# Patient Record
Sex: Male | Born: 1995 | Race: White | Hispanic: No | Marital: Single | Smoking: Never smoker
Health system: Southern US, Community
[De-identification: ages and names within clinical notes are randomized; demographics above are authoritative.]

## PROBLEM LIST (undated history)

## (undated) DIAGNOSIS — F980 Enuresis not due to a substance or known physiological condition: Secondary | ICD-10-CM

## (undated) HISTORY — DX: Enuresis not due to a substance or known physiological condition: F98.0

---
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
## 2015-12-21 ENCOUNTER — Telehealth: Payer: Self-pay | Admitting: Internal Medicine

## 2015-12-21 NOTE — Telephone Encounter (Signed)
Patient's mother, Amy,called.  Patient needs a copy of his immunization records for college.  Please call Amy when it's ready for pick up.

## 2015-12-21 NOTE — Telephone Encounter (Signed)
Spoke to Mom. Record up front ready for pickup 

## 2016-01-19 ENCOUNTER — Encounter: Payer: Self-pay | Admitting: Internal Medicine

## 2016-01-19 ENCOUNTER — Ambulatory Visit (INDEPENDENT_AMBULATORY_CARE_PROVIDER_SITE_OTHER): Payer: 59 | Admitting: Internal Medicine

## 2016-01-19 VITALS — BP 122/80 | HR 100 | Temp 98.1°F | Wt 242.0 lb

## 2016-01-19 DIAGNOSIS — L03032 Cellulitis of left toe: Secondary | ICD-10-CM | POA: Insufficient documentation

## 2016-01-19 MED ORDER — CEPHALEXIN 500 MG PO TABS: 500.0000 mg | ORAL_TABLET | Freq: Four times a day (QID) | ORAL | Status: DC

## 2016-01-19 NOTE — Progress Notes (Signed)
Pre visit review using our clinic review tool, if applicable. No additional management support is needed unless otherwise documented below in the visit note. 

## 2016-01-19 NOTE — Assessment & Plan Note (Signed)
Will try keflex and soaks May need debridement of nail

## 2016-01-19 NOTE — Patient Instructions (Addendum)
Please soak your foot in warm water (you can add epsom salts if you want). If the swelling is better and the nail seems to be ingrown, you can come in for me to remove the ingrown part of the nail.  Paronychia Paronychia is an infection of the skin that surrounds a nail. It usually affects the skin around a fingernail, but it may also occur near a toenail. It often causes pain and swelling around the nail. This condition may come on suddenly or develop over a longer period. In some cases, a collection of pus (abscess) can form near or under the nail. Usually, paronychia is not serious and it clears up with treatment. CAUSES This condition may be caused by bacteria or fungi. It is commonly caused by either Streptococcus or Staphylococcus bacteria. The bacteria or fungi often cause the infection by getting into the affected area through an opening in the skin, such as a cut or a hangnail. RISK FACTORS This condition is more likely to develop in:  People who get their hands wet often, such as those who work as Fish farm manager, bartenders, or nurses.  People who bite their fingernails or suck their thumbs.  People who trim their nails too short.  People who have hangnails or injured fingertips.  People who get manicures.  People who have diabetes. SYMPTOMS Symptoms of this condition include:  Redness and swelling of the skin near the nail.  Tenderness around the nail when you touch the area.  Pus-filled bumps under the cuticle. The cuticle is the skin at the base or sides of the nail.  Fluid or pus under the nail.  Throbbing pain in the area. DIAGNOSIS This condition is usually diagnosed with a physical exam. In some cases, a sample of pus may be taken from an abscess to be tested in a lab. This can help to determine what type of bacteria or fungi is causing the condition. TREATMENT Treatment for this condition depends on the cause and severity of the condition. If the condition is mild,  it may clear up on its own in a few days. Your health care provider may recommend soaking the affected area in warm water a few times a day. When treatment is needed, the options may include:  Antibiotic medicine, if the condition is caused by a bacterial infection.  Antifungal medicine, if the condition is caused by a fungal infection.  Incision and drainage, if an abscess is present. In this procedure, the health care provider will cut open the abscess so the pus can drain out. HOME CARE INSTRUCTIONS  Soak the affected area in warm water if directed to do so by your health care provider. You may be told to do this for 20 minutes, 2-3 times a day. Keep the area dry in between soakings.  Take medicines only as directed by your health care provider.  If you were prescribed an antibiotic medicine, finish all of it even if you start to feel better.  Keep the affected area clean.  Do not try to drain a fluid-filled bump yourself.  If you will be washing dishes or performing other tasks that require your hands to get wet, wear rubber gloves. You should also wear gloves if your hands might come in contact with irritating substances, such as cleaners or chemicals.  Follow your health care provider's instructions about:  Wound care.  Bandage (dressing) changes and removal. SEEK MEDICAL CARE IF:  Your symptoms get worse or do not improve with treatment.  You have a fever or chills.  You have redness spreading from the affected area.  You have continued or increased fluid, blood, or pus coming from the affected area.  Your finger or knuckle becomes swollen or is difficult to move.   This information is not intended to replace advice given to you by your health care provider. Make sure you discuss any questions you have with your health care provider.   Document Released: 03/12/2001 Document Revised: 01/31/2015 Document Reviewed: 08/24/2014 Elsevier Interactive Patient Education [id number]  [employer]

## 2016-01-19 NOTE — Progress Notes (Signed)
   Subjective:    Patient ID: Trevor Davidson, male    DOB: Nov 02, 1995, 20 y.o.   MRN: [id number]  HPI Here due to left great toe injury  Got cleated in tag football game 4-5 months ago Seemed to get some better Then stubbed it bad 3-4 weeks ago Ongoing worsening  Very tender to touch Walks without pain--unless socks are rubbing  Has cleaned it and tried triple antibiotic ointment  No current outpatient prescriptions on file prior to visit.   No current facility-administered medications on file prior to visit.    Allergies  Allergen Reactions  . Sulfonamide Derivatives     Past Medical History  Diagnosis Date  . Nonorganic enuresis     No past surgical history on file.  No family history on file.  Social History   Social History  . Marital Status: Single    Spouse Name: N/A  . Number of Children: N/A  . Years of Education: N/A   Occupational History  . Not on file.   Social History Main Topics  . Smoking status: Never Smoker   . Smokeless tobacco: Never Used  . Alcohol Use: No  . Drug Use: No  . Sexual Activity: Not on file   Other Topics Concern  . Not on file   Social History Narrative   Parents married   2 sibs   Review of Systems  No fever Hasn't felt sick     Objective:   Physical Exam  Skin:  Marked swelling, redness and mild tenderness along lateral left great toe Some discharge Unable to tell if nail is ingrown due to the swelling          Assessment & Plan:

## 2016-02-14 ENCOUNTER — Encounter: Payer: Self-pay | Admitting: Internal Medicine

## 2016-02-14 ENCOUNTER — Ambulatory Visit (INDEPENDENT_AMBULATORY_CARE_PROVIDER_SITE_OTHER): Payer: 59 | Admitting: Internal Medicine

## 2016-02-14 VITALS — BP 118/82 | HR 75 | Temp 97.4°F | Wt 247.0 lb

## 2016-02-14 DIAGNOSIS — L600 Ingrowing nail: Secondary | ICD-10-CM | POA: Diagnosis not present

## 2016-02-14 MED ORDER — CEPHALEXIN 500 MG PO TABS: 500.0000 mg | ORAL_TABLET | Freq: Four times a day (QID) | ORAL | Status: DC

## 2016-02-14 NOTE — Progress Notes (Signed)
   Subjective:    Patient ID: Trevor Davidson, male    DOB: Sep 08, 1996, 20 y.o.   MRN: [id number]  HPI Here due to ongoing toe problems Not really any better  Current Outpatient Prescriptions on File Prior to Visit  Medication Sig Dispense Refill  . cetirizine-pseudoephedrine (ZYRTEC-D) 5-120 MG tablet Take 1 tablet by mouth 2 (two) times daily.     No current facility-administered medications on file prior to visit.    Allergies  Allergen Reactions  . Sulfonamide Derivatives     Past Medical History  Diagnosis Date  . Nonorganic enuresis     No past surgical history on file.  No family history on file.  Social History   Social History  . Marital Status: Single    Spouse Name: N/A  . Number of Children: N/A  . Years of Education: N/A   Occupational History  . Not on file.   Social History Main Topics  . Smoking status: Never Smoker   . Smokeless tobacco: Never Used  . Alcohol Use: No  . Drug Use: No  . Sexual Activity: Not on file   Other Topics Concern  . Not on file   Social History Narrative   Parents married   2 sibs      Review of Systems     Objective:   Physical Exam  Skin:  Left great toenail ingrown laterally with swelling and redness          Assessment & Plan:

## 2016-02-14 NOTE — Assessment & Plan Note (Signed)
PROCEDURE Digital block to left great toe Lateral resection of ingrown nail and debridement of devitalized tissue Tolerated well DSD with pressure applied Home care instructions given Extend antibiotic

## 2016-02-14 NOTE — Progress Notes (Signed)
Pre visit review using our clinic review tool, if applicable. No additional management support is needed unless otherwise documented below in the visit note. 

## 2016-04-16 ENCOUNTER — Ambulatory Visit (INDEPENDENT_AMBULATORY_CARE_PROVIDER_SITE_OTHER): Payer: 59 | Admitting: Internal Medicine

## 2016-04-16 ENCOUNTER — Encounter: Payer: Self-pay | Admitting: Internal Medicine

## 2016-04-16 VITALS — BP 118/80 | HR 75 | Temp 98.4°F | Wt 245.0 lb

## 2016-04-16 DIAGNOSIS — L600 Ingrowing nail: Secondary | ICD-10-CM

## 2016-04-16 MED ORDER — CEPHALEXIN 500 MG PO TABS: 500.0000 mg | ORAL_TABLET | Freq: Three times a day (TID) | ORAL | Status: AC

## 2016-04-16 NOTE — Progress Notes (Signed)
   Subjective:    Patient ID: Trevor Davidson, male    DOB: 07-11-1996, 20 y.o.   MRN: [id number]  HPI  Pt presents to the clinic todaywith a complaint of swelling and redness lateral to the left great toenail x 2 days.  He reports the same symptoms occurred 2 months ago, at which time he was seen for removal of an ingrown toenail and completed a course of Keflex with complete resolution of symptoms.  He reports the toe is warm and painful to the touch, and will occasionally have shooting pain in the toe at a severity of 3-4/10.  He admits to discharge but is unsure of the color, and denies fever, chills, or decreased range of motion of toes.       Review of Systems   Past Medical History  Diagnosis Date  . Nonorganic enuresis     Current Outpatient Prescriptions  Medication Sig Dispense Refill  . Cephalexin 500 MG tablet Take 1 tablet (500 mg total) by mouth 3 (three) times daily. 30 tablet 0  . cetirizine-pseudoephedrine (ZYRTEC-D) 5-120 MG tablet Take 1 tablet by mouth 2 (two) times daily. Reported on 04/16/2016     No current facility-administered medications for this visit.    Allergies  Allergen Reactions  . Sulfonamide Derivatives     History reviewed. No pertinent family history.  Social History   Social History  . Marital Status: Single    Spouse Name: N/A  . Number of Children: N/A  . Years of Education: N/A   Occupational History  . Not on file.   Social History Main Topics  . Smoking status: Never Smoker   . Smokeless tobacco: Never Used  . Alcohol Use: No  . Drug Use: No  . Sexual Activity: Not on file   Other Topics Concern  . Not on file   Social History Narrative   Parents married   2 sibs     Const: Denies fever or chills. Skin: Admits to redness, swelling, and discharge lateral to left great toenail. MSK: Denies decreased range of motion of toes.  No other specific complaints in a complete review of systems (except as listed in HPI  above).      Objective:   Physical Exam BP 118/80 mmHg  Pulse 75  Temp(Src) 98.4 F (36.9 C) (Oral)  Wt 245 lb (111.131 kg)  SpO2 99%  General: Well-appearing, in no acute distress. Skin: Erythema and swelling of left great toe lateral to nail with blood and discharge present, erythema extends proximally to interphalangeal joint, nontender to palpation.  Slight swelling present lateral to nail on right great toe.   MSK: Full AROM of toes.  Full AROM and 5/5 strength of ankle.       Assessment & Plan:   Ingrown toenail left great toe with infection/ingrown toenail right great toe without infection:  eRx for Keflex 500mg  TIDx 10 days Epson Salt soaks Referral to Podiatrist  RTC as needed or if symptoms persist or worsne

## 2016-04-16 NOTE — Patient Instructions (Signed)
Ingrown Toenail  An ingrown toenail occurs when the corner or sides of your toenail grow into the surrounding skin. The big toe is most commonly affected, but it can happen to any of your toes. If your ingrown toenail is not treated, you will be at risk for infection.  CAUSES  This condition may be caused by:  · Wearing shoes that are too small or tight.  · Injury or trauma, such as stubbing your toe or having your toe stepped on.  · Improper cutting or care of your toenails.  · Being born with (congenital) nail or foot abnormalities, such as having a nail that is too big for your toe.  RISK FACTORS  Risk factors for an ingrown toenail include:  · Age. Your nails tend to thicken as you get older, so ingrown nails are more common in older people.  · Diabetes.  · Cutting your toenails incorrectly.  · Blood circulation problems.  SYMPTOMS  Symptoms may include:  · Pain, soreness, or tenderness.  · Redness.  · Swelling.  · Hardening of the skin surrounding the toe.  Your ingrown toenail may be infected if there is fluid, pus, or drainage.  DIAGNOSIS   An ingrown toenail may be diagnosed by medical history and physical exam. If your toenail is infected, your health care provider may test a sample of the drainage.  TREATMENT  Treatment depends on the severity of your ingrown toenail. Some ingrown toenails may be treated at home. More severe or infected ingrown toenails may require surgery to remove all or part of the nail. Infected ingrown toenails may also be treated with antibiotic medicines.  HOME CARE INSTRUCTIONS  · If you were prescribed an antibiotic medicine, finish all of it even if you start to feel better.  · Soak your foot in warm soapy water for 20 minutes, 3 times per day or as directed by your health care provider.  · Carefully lift the edge of the nail away from the sore skin by wedging a small piece of cotton under the corner of the nail. This may help with the pain.  Be careful not to cause more injury  to the area.  · Wear shoes that fit well. If your ingrown toenail is causing you pain, try wearing sandals, if possible.  · Trim your toenails regularly and carefully. Do not cut them in a curved shape. Cut your toenails straight across. This prevents injury to the skin at the corners of the toenail.  · Keep your feet clean and dry.  · If you are having trouble walking and are given crutches by your health care provider, use them as directed.  · Do not pick at your toenail or try to remove it yourself.  · Take medicines only as directed by your health care provider.  · Keep all follow-up visits as directed by your health care provider. This is important.  SEEK MEDICAL CARE IF:  · Your symptoms do not improve with treatment.  SEEK IMMEDIATE MEDICAL CARE IF:  · You have red streaks that start at your foot and go up your leg.  · You have a fever.  · You have increased redness, swelling, or pain.  · You have fluid, blood, or pus coming from your toenail.     This information is not intended to replace advice given to you by your health care provider. Make sure you discuss any questions you have with your health care provider.     Document Released:   09/13/2000 Document Revised: 01/31/2015 Document Reviewed: 08/10/2014  [employer]vier Interactive Patient Education ©2016 [employer]

## 2016-04-16 NOTE — Progress Notes (Signed)
Pre visit review using our clinic review tool, if applicable. No additional management support is needed unless otherwise documented below in the visit note. 

## 2016-04-18 ENCOUNTER — Encounter: Payer: Self-pay | Admitting: Podiatry

## 2016-04-18 ENCOUNTER — Ambulatory Visit (INDEPENDENT_AMBULATORY_CARE_PROVIDER_SITE_OTHER): Payer: 59 | Admitting: Podiatry

## 2016-04-18 VITALS — BP 135/83 | HR 80 | Resp 16 | Ht 75.0 in | Wt 240.0 lb

## 2016-04-18 DIAGNOSIS — L600 Ingrowing nail: Secondary | ICD-10-CM

## 2016-04-18 NOTE — Progress Notes (Signed)
   Subjective:    Patient ID: Trevor DaughtersHeath C Davidson, male    DOB: Jul 15, 1996, 20 y.o.   MRN: [id number]  HPI Chief Complaint  Patient presents with  . Nail Problem    Left foot; great toe-lateral; pt stated, "Doctor gave him antibiotics starting yesterday 04/17/16"      Review of Systems  All other systems reviewed and are negative.      Objective:   Physical Exam        Assessment & Plan:

## 2016-04-18 NOTE — Patient Instructions (Signed)

## 2016-04-20 NOTE — Progress Notes (Signed)
Subjective:     Patient ID: Trevor Davidson, male   DOB: 12/29/1995, 20 y.o.   MRN: [id number]  HPI patient presents with painful ingrown toenails left big toe lateral border with incurvation of the bed distal redness and no proximal edema erythema drainage noted and states it's been present for several months   Review of Systems  All other systems reviewed and are negative.      Objective:   Physical Exam  Constitutional: He is oriented to person, place, and time.  Musculoskeletal: Normal range of motion.  Neurological: He is oriented to person, place, and time.  Skin: Skin is warm.  Vitals reviewed.  neurovascular status intact muscle strength adequate range of motion within normal limits with patient noted to have incurvated left hallux lateral border that's painful when pressed with distal redness and drainage noted. Patient's noted to have good digital perfusion and is well oriented 3     Assessment:     Ingrown toenail deformity left hallux lateral border with pain    Plan:     H&P condition reviewed and recommended correction of deformity. Patient wants this done understanding risk and today I infiltrated 60 mg Xylocaine Marcaine mixture remove the lateral corner exposed matrix and applied phenol 3 applications 30 seconds followed by alcohol lavage and sterile dressing. Patient will be seen back to reevaluate

## 2016-05-14 ENCOUNTER — Telehealth: Payer: Self-pay | Admitting: *Deleted

## 2016-05-14 NOTE — Telephone Encounter (Signed)
Left message for pt at [phone] (Home #) to check to see how they were doing from their ingrown toenail procedure that was performed on Thursday, April 18, 2016. Waiting for a response.
